# Patient Record
Sex: Male | Born: 1975 | Race: White | Hispanic: No | Marital: Married | Smoking: Never smoker
Health system: Southern US, Community
[De-identification: ages and names within clinical notes are randomized; demographics above are authoritative.]

## PROBLEM LIST (undated history)

## (undated) DIAGNOSIS — L309 Dermatitis, unspecified: Secondary | ICD-10-CM

## (undated) DIAGNOSIS — B1920 Unspecified viral hepatitis C without hepatic coma: Secondary | ICD-10-CM

---

## 2009-02-01 ENCOUNTER — Encounter (HOSPITAL_COMMUNITY)
Admission: RE | Admit: 2009-02-01 | Discharge: 2009-05-02 | Payer: Self-pay | Source: Home / Self Care | Admitting: Gastroenterology

## 2009-05-19 ENCOUNTER — Encounter (HOSPITAL_COMMUNITY): Admission: RE | Admit: 2009-05-19 | Discharge: 2009-08-17 | Payer: Self-pay | Admitting: Gastroenterology

## 2010-07-04 LAB — IRON AND TIBC
Saturation Ratios: 75 % — ABNORMAL HIGH (ref 20–55)
TIBC: 331 ug/dL (ref 215–435)
UIBC: 83 ug/dL

## 2010-07-04 LAB — CBC
HCT: 40.2 % (ref 39.0–52.0)
Hemoglobin: 14.1 g/dL (ref 13.0–17.0)
RBC: 4.16 MIL/uL — ABNORMAL LOW (ref 4.22–5.81)
RDW: 13.5 % (ref 11.5–15.5)
WBC: 3.2 10*3/uL — ABNORMAL LOW (ref 4.0–10.5)

## 2010-07-04 LAB — FERRITIN: Ferritin: 594 ng/mL — ABNORMAL HIGH (ref 22–322)

## 2010-07-17 LAB — CBC
MCV: 97.6 fL (ref 78.0–100.0)
Platelets: 65 10*3/uL — ABNORMAL LOW (ref 150–400)
RBC: 4.23 MIL/uL (ref 4.22–5.81)
WBC: 3.5 10*3/uL — ABNORMAL LOW (ref 4.0–10.5)

## 2010-07-17 LAB — IRON AND TIBC
Iron: 130 ug/dL (ref 42–135)
TIBC: 330 ug/dL (ref 215–435)
UIBC: 200 ug/dL

## 2010-07-18 LAB — IRON AND TIBC: TIBC: 310 ug/dL (ref 215–435)

## 2010-07-18 LAB — CBC
Hemoglobin: 14.1 g/dL (ref 13.0–17.0)
MCHC: 34.8 g/dL (ref 30.0–36.0)
Platelets: 69 10*3/uL — ABNORMAL LOW (ref 150–400)
RDW: 15.6 % — ABNORMAL HIGH (ref 11.5–15.5)

## 2015-11-13 ENCOUNTER — Emergency Department (HOSPITAL_COMMUNITY): Payer: BLUE CROSS/BLUE SHIELD

## 2015-11-13 ENCOUNTER — Emergency Department (HOSPITAL_COMMUNITY)
Admission: EM | Admit: 2015-11-13 | Discharge: 2015-11-13 | Disposition: A | Discharge status: Home / Self Care | Payer: BLUE CROSS/BLUE SHIELD | Attending: Emergency Medicine | Admitting: Emergency Medicine

## 2015-11-13 ENCOUNTER — Encounter (HOSPITAL_COMMUNITY): Payer: Self-pay | Admitting: Emergency Medicine

## 2015-11-13 DIAGNOSIS — Z79899 Other long term (current) drug therapy: Secondary | ICD-10-CM | POA: Diagnosis not present

## 2015-11-13 DIAGNOSIS — R233 Spontaneous ecchymoses: Secondary | ICD-10-CM

## 2015-11-13 DIAGNOSIS — R509 Fever, unspecified: Secondary | ICD-10-CM

## 2015-11-13 HISTORY — DX: Dermatitis, unspecified: L30.9

## 2015-11-13 HISTORY — DX: Unspecified viral hepatitis C without hepatic coma: B19.20

## 2015-11-13 LAB — CBC WITH DIFFERENTIAL/PLATELET
Basophils Absolute: 0 K/uL (ref 0.0–0.1)
Basophils Relative: 1 %
Eosinophils Absolute: 0 K/uL (ref 0.0–0.7)
Eosinophils Relative: 0 %
HCT: 44.4 % (ref 39.0–52.0)
Hemoglobin: 16.1 g/dL (ref 13.0–17.0)
Lymphocytes Relative: 23 %
Lymphs Abs: 0.9 K/uL (ref 0.7–4.0)
MCH: 31.1 pg (ref 26.0–34.0)
MCHC: 36.3 g/dL — ABNORMAL HIGH (ref 30.0–36.0)
MCV: 85.9 fL (ref 78.0–100.0)
Monocytes Absolute: 0.5 K/uL (ref 0.1–1.0)
Monocytes Relative: 13 %
Neutro Abs: 2.4 K/uL (ref 1.7–7.7)
Neutrophils Relative %: 64 %
Platelets: 94 K/uL — ABNORMAL LOW (ref 150–400)
RBC: 5.17 MIL/uL (ref 4.22–5.81)
RDW: 12.7 % (ref 11.5–15.5)
WBC: 3.8 K/uL — ABNORMAL LOW (ref 4.0–10.5)

## 2015-11-13 LAB — COMPREHENSIVE METABOLIC PANEL WITH GFR
ALT: 32 U/L (ref 17–63)
AST: 32 U/L (ref 15–41)
Albumin: 4.5 g/dL (ref 3.5–5.0)
Alkaline Phosphatase: 38 U/L (ref 38–126)
Anion gap: 7 (ref 5–15)
BUN: 10 mg/dL (ref 6–20)
CO2: 27 mmol/L (ref 22–32)
Calcium: 9 mg/dL (ref 8.9–10.3)
Chloride: 99 mmol/L — ABNORMAL LOW (ref 101–111)
Creatinine, Ser: 0.92 mg/dL (ref 0.61–1.24)
GFR calc Af Amer: >60 mL/min
GFR calc non Af Amer: >60 mL/min
Glucose, Bld: 126 mg/dL — ABNORMAL HIGH (ref 65–99)
Potassium: 3.4 mmol/L — ABNORMAL LOW (ref 3.5–5.1)
Sodium: 133 mmol/L — ABNORMAL LOW (ref 135–145)
Total Bilirubin: 1.2 mg/dL (ref 0.3–1.2)
Total Protein: 8.4 g/dL — ABNORMAL HIGH (ref 6.5–8.1)

## 2015-11-13 LAB — CK: Total CK: 167 U/L (ref 49–397)

## 2015-11-13 LAB — URINALYSIS, ROUTINE W REFLEX MICROSCOPIC
Glucose, UA: NEGATIVE mg/dL
Hgb urine dipstick: NEGATIVE
Ketones, ur: NEGATIVE mg/dL
Leukocytes, UA: NEGATIVE
Nitrite: NEGATIVE
Protein, ur: NEGATIVE mg/dL
Specific Gravity, Urine: 1.022 (ref 1.005–1.030)
pH: 6 (ref 5.0–8.0)

## 2015-11-13 LAB — I-STAT CG4 LACTIC ACID, ED: Lactic Acid, Venous: 1.25 mmol/L (ref 0.5–1.9)

## 2015-11-13 MED ORDER — DOXYCYCLINE HYCLATE 100 MG PO TABS
100.0000 mg | ORAL_TABLET | Freq: Once | ORAL | Status: AC
  Administered 2015-11-13: 100 mg via ORAL
  Filled 2015-11-13: qty 1

## 2015-11-13 MED ORDER — DOXYCYCLINE HYCLATE 100 MG PO CAPS: 100.0000 mg | ORAL_CAPSULE | Freq: Two times a day (BID) | ORAL | 0 refills | Status: AC

## 2015-11-13 MED ORDER — ACETAMINOPHEN 325 MG PO TABS
650.0000 mg | ORAL_TABLET | Freq: Once | ORAL | Status: AC
  Administered 2015-11-13: 650 mg via ORAL
  Filled 2015-11-13: qty 2

## 2015-11-13 MED ORDER — SODIUM CHLORIDE 0.9 % IV BOLUS (SEPSIS)
1000.0000 mL | Freq: Once | INTRAVENOUS | Status: AC
  Administered 2015-11-13: 1000 mL via INTRAVENOUS

## 2015-11-13 NOTE — Discharge Instructions (Signed)
Take antibiotics as prescribed. Follow up with your primary care provider. You will need to follow up on these results. Return to the ED if you experience severe neck pain/stiffness, vomiting, increased rash, chest pain, difficulty breathing, confusion.

## 2015-11-13 NOTE — ED Provider Notes (Signed)
[facility]-EMERGENCY DEPT Provider Note   CSN: [id number] Arrival date & time: 11/13/15  1347  First Provider Contact:  First MD Initiated Contact with Patient 11/13/15 1857        History   Chief Complaint Chief Complaint  Patient presents with  . Rash    sent by PCP for possible RMSF  . Neck Pain    HPI Paul Mccarty is a 40 y.o. male with hepatitis C, cirrhosis who presents to the ED today complaining of fever. Patient states that last Thursday, 4 days ago, he worked out very hard. The following day he felt diffuse body aches and neck pain. Patient states that he thought this was from working out so he did not take anything of it. However, following that he developed a fever of 102. He did not take anything for his fever. Patient states that he has continued to have an intermittent fever since then ranging up to 103.5. Patient states that he saw his PCP today and they noticed a petechial rash on the dorsum of his bilateral hands so they sent him to the ED with concern of [facility] spotted fever. 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 He doesn't recall any tick bites. He denies any sore throat, cough, dysuria, chest pain or shortness of breath. Patient states that his neck is sore feels like a muscle cramp. He is able to move it in all directions without difficulty. He denies any nausea or vomiting.  HPI  Past Medical History:  Diagnosis Date  . Dermatitis   . Hepatitis C     There are no active problems to display for this patient.   History reviewed. No pertinent surgical history.     Home Medications    Prior to Admission medications   Medication Sig Start Date End Date Taking? Authorizing Provider  acetaminophen (TYLENOL) 650 MG CR tablet Take 1,300 mg by mouth every 8 (eight) hours as needed for pain.   Yes Historical Provider, MD  Doxylamine Succinate, Sleep, (SLEEP AID PO) Take 2 tablets by mouth at bedtime as needed (for sleep).   Yes  Historical Provider, MD    Family History History reviewed. No pertinent family history.  Social History Social History  Substance Use Topics  . Smoking status: Never Smoker  . Smokeless tobacco: Never Used  . Alcohol use No     Allergies   Nsaids; Codeine; Erythromycin; and Talwin [pentazocine]   Review of Systems Review of Systems  All other systems reviewed and are negative.    Physical Exam Updated Vital Signs BP 111/84 (BP Location: Right Arm)   Pulse 91   Temp 101.1 F (38.4 C) (Oral)   Resp 17   Ht  (1.778 m)   Wt 99.8 kg   SpO2 98%   BMI 31.57 kg/m   Physical Exam  Constitutional: He is oriented to person, place, and time. He appears well-developed and well-nourished. No distress.  HENT:  Head: Normocephalic and atraumatic.  Mouth/Throat: No oropharyngeal exudate.  Eyes: Conjunctivae and EOM are normal. Pupils are equal, round, and reactive to light. Right eye exhibits no discharge. Left eye exhibits no discharge. No scleral icterus.  Neck: Normal range of motion.  No meningismus  Cardiovascular: Normal rate, regular rhythm, normal heart sounds and intact distal pulses.  Exam reveals no gallop and no friction rub.   No murmur heard. Pulmonary/Chest: Effort normal and breath sounds normal. No respiratory distress. He has no [id number]. He has  no rales. He exhibits no tenderness.  Abdominal: Soft. He exhibits no distension. There is no tenderness. There is no guarding.  Musculoskeletal: Normal range of motion. He exhibits no edema.  Neurological: He is alert and oriented to person, place, and time.  Skin: Skin is warm and dry. Rash noted. He is not diaphoretic. No erythema. No pallor.  Petechial rash over dorsum of bilateral hands  Psychiatric: He has a normal mood and affect. His behavior is normal.  Nursing note and vitals reviewed.    ED Treatments / Results  Labs (all labs ordered are listed, but only abnormal results are displayed) Labs  Reviewed  CULTURE, BLOOD (ROUTINE X 2)  CULTURE, BLOOD (ROUTINE X 2)  COMPREHENSIVE METABOLIC PANEL  CBC WITH DIFFERENTIAL/PLATELET  URINALYSIS, ROUTINE W REFLEX MICROSCOPIC (NOT AT [facility])  ROCKY MTN SPOTTED FVR ABS PNL(IGG+IGM)  B. BURGDORFI ANTIBODIES  CK  I-STAT CG4 LACTIC ACID, ED    EKG  EKG Interpretation None       Radiology Dg Chest 2 View  Result Date: 11/13/2015 CLINICAL DATA:  40 year old male with possible [facility] spotted fever. Initial encounter. History of hepatitis-C. EXAM: CHEST  2 VIEW COMPARISON:  None. FINDINGS: Normal lung volumes. Normal cardiac size and mediastinal contours. Visualized tracheal air column is within normal limits. The lungs are clear. No pneumothorax or pleural effusion. Mild levoconvex spinal curvature. No acute osseous abnormality identified. IMPRESSION: Negative, no acute cardiopulmonary abnormality. Electronically Signed   By: Odessa Fleming M.D.   On: 11/13/2015 19:49    Procedures Procedures (including critical care time)  Medications Ordered in ED Medications  sodium chloride 0.9 % bolus 1,000 mL (1,000 mLs Intravenous New Bag/Given 11/13/15 1952)  acetaminophen (TYLENOL) tablet 650 mg (650 mg Oral Given 11/13/15 1936)     Initial Impression / Assessment and Plan / ED Course  I have reviewed the triage vital signs and the nursing notes.  Pertinent labs & imaging results that were available during my care of the patient were reviewed by me and considered in my medical decision making (see chart for details).  Clinical Course    40 year old male with pmhx of hepatitis C, Cirrhosis presents to the ED with fever of unknown origin for 3-4 days. Patient seen by his PCP today she noticed a petechial rash in the dorsum of his hands became concerned [place] fever. On exam, patient appears well. He is nontoxic and nonseptic appearing. No meningeal signs. Abdomen is nontender. Chest x-ray unremarkable, UA negative for  infection. No source of infection found. No leukocytosis. Pt does not meet SIRS or sepsis criteria. [facility] spotted fever and Lyme disease titers were sent. Temp now coming down, 98.9 now. Patient reports significant symptomatic improvement after IV fluids. Patient will be discharged home with doxycycline for [facility] spotted fever prophylaxis. He will follow up with his PCP tomorrow for reevaluation. Strict return precautions given and discussed.  Patient was discussed with and seen by Dr. Jacqulyn Bath who agrees with the treatment plan.    Final Clinical Impressions(s) / ED Diagnoses   Final diagnoses:  None    New Prescriptions New Prescriptions   No medications on file     Dub Mikes, PA-C 11/15/15 1155    Maia Plan, MD 11/15/15 [phone]

## 2015-11-13 NOTE — ED Triage Notes (Signed)
Pt sent by PCP for possible [facility] spotted fever. Pt works outside as a Chiropractor. Pt began to have a fever 3 days ago accompanied by body aches, sweating, mild HA and a petichial rash. WBC unremarkable at PCP, but was sent for further eval.

## 2015-11-13 NOTE — ED Notes (Signed)
Pt sent by [facility] c/o fever, rash on L hand, and malaise x 3 days.  PCP reports fever of 102.6 in office.  Sts Pt works outside.  Hx of Hep C.

## 2015-11-15 LAB — B. BURGDORFI ANTIBODIES: B burgdorferi Ab IgG+IgM: <0.91 {ISR} (ref 0.00–0.90)

## 2015-11-18 LAB — CULTURE, BLOOD (ROUTINE X 2)
Culture: NO GROWTH
Culture: NO GROWTH

## 2015-11-18 LAB — ROCKY MTN SPOTTED FVR ABS PNL(IGG+IGM)
RMSF IgG: POSITIVE — AB
RMSF IgM: 1.22 {index} — ABNORMAL HIGH (ref 0.00–0.89)

## 2015-11-18 LAB — RMSF, IGG, IFA: RMSF, IGG, IFA: <1:64 {titer}

## 2017-09-22 IMAGING — CR DG CHEST 2V
2 series · 2 of 2 positions shown · non-contrast
Comparison: None.

CLINICAL DATA: 40-year-old male with possible [place] fever. Initial encounter. History of hepatitis-C.

EXAM:
CHEST  2 VIEW

[w chest pa]
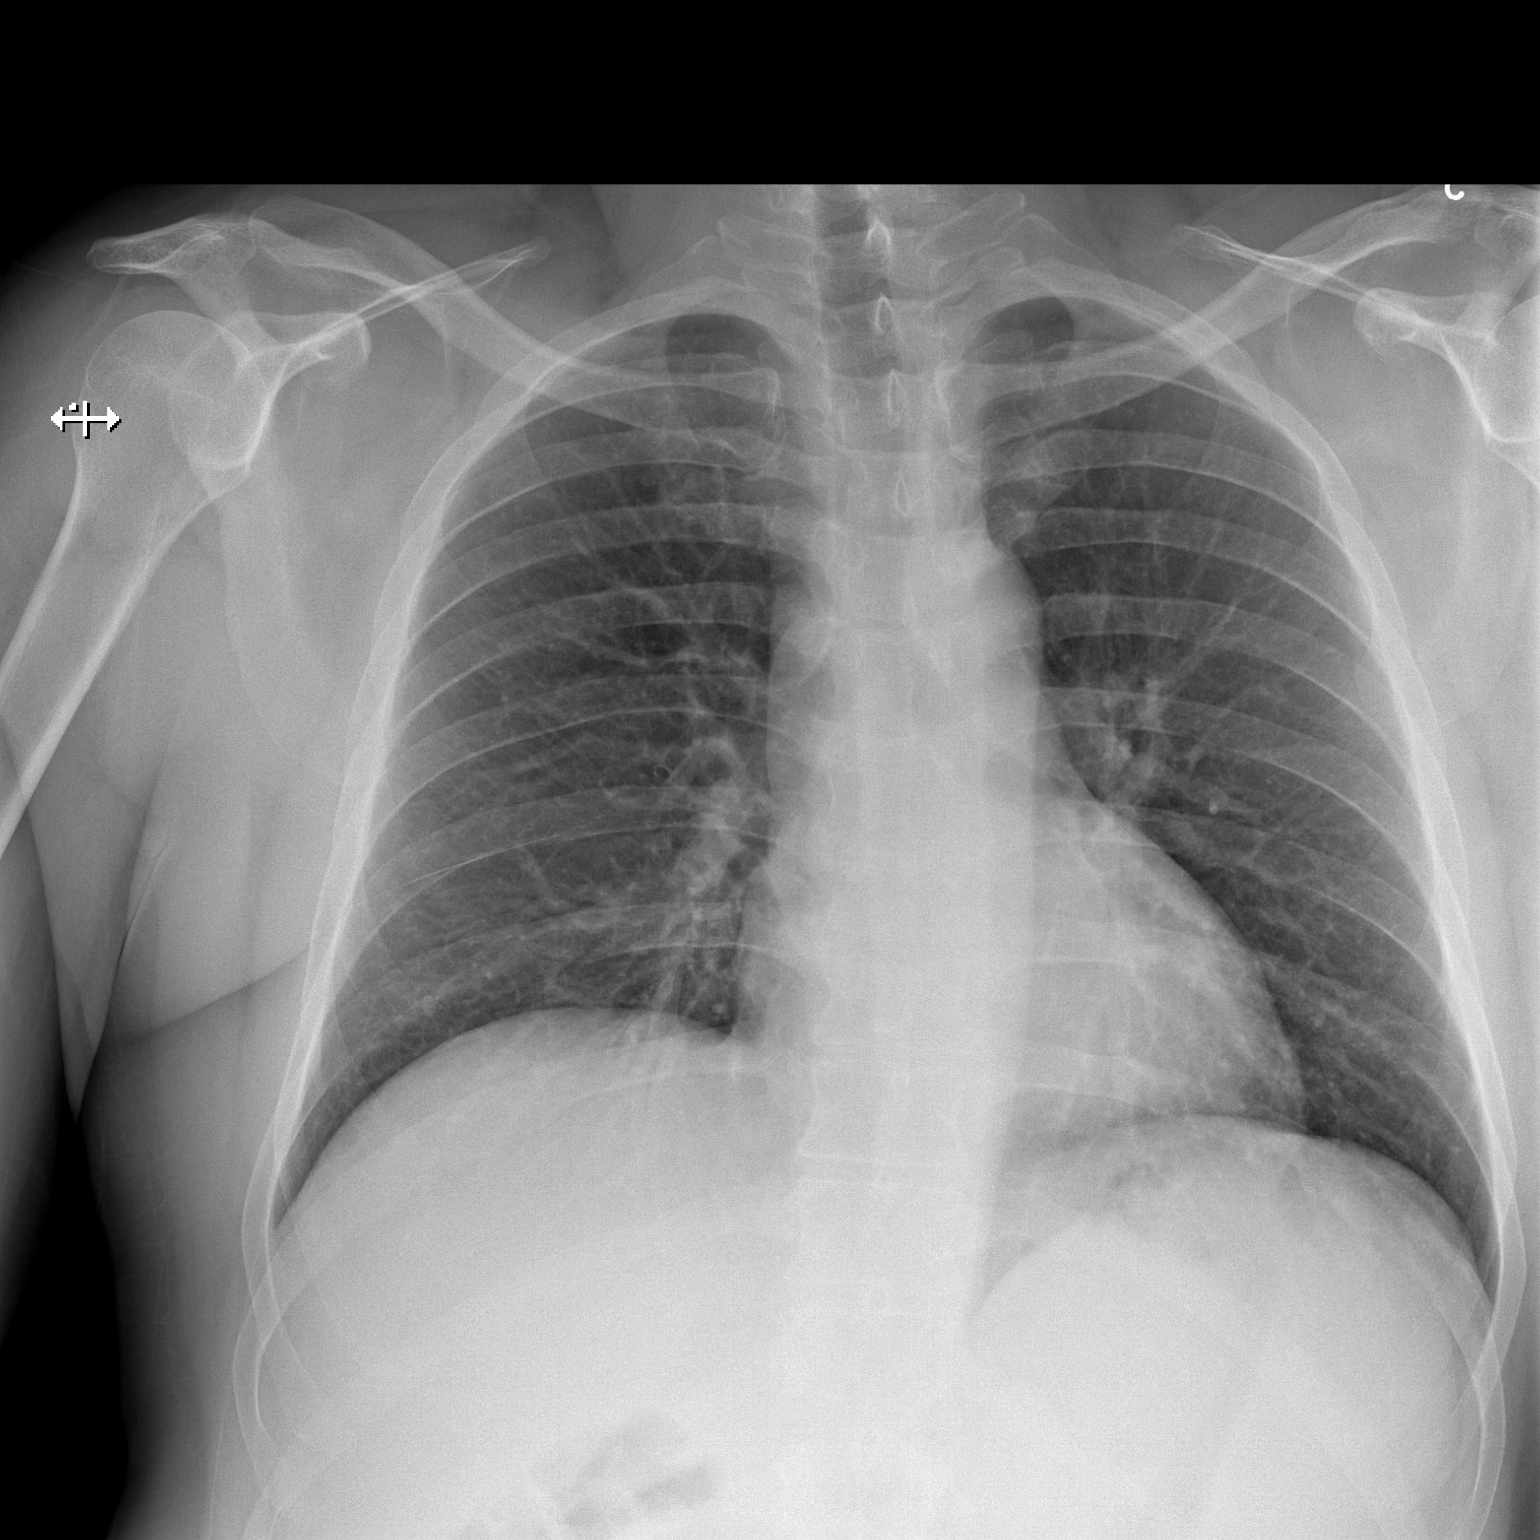

[w chest lat]
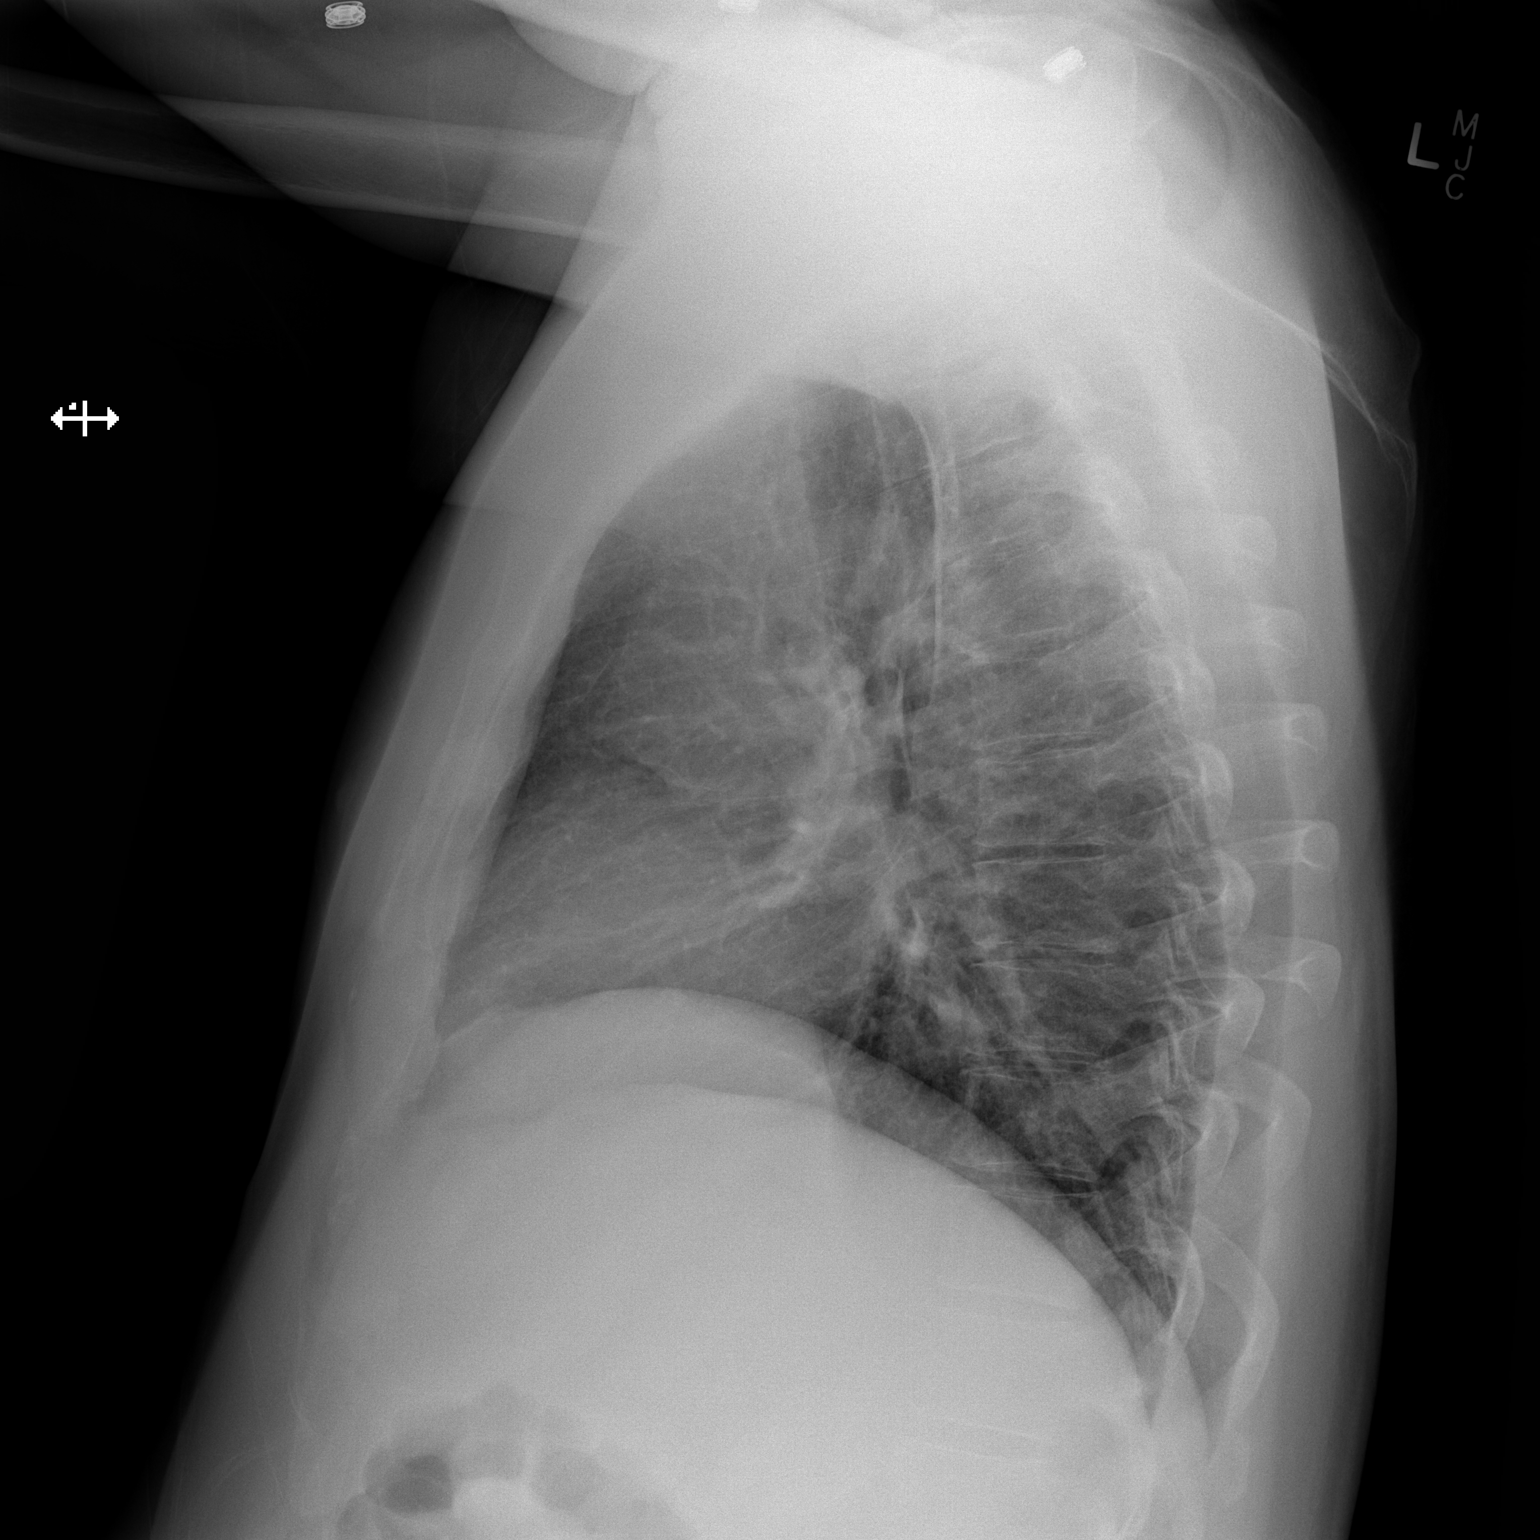

[2 of 2 positions shown; findings below may reference images not displayed]

FINDINGS: Normal lung volumes. Normal cardiac size and mediastinal contours.
Visualized tracheal air column is within normal limits. The lungs
are clear. No pneumothorax or pleural effusion. Mild levoconvex
spinal curvature. No acute osseous abnormality identified.
IMPRESSION: Negative, no acute cardiopulmonary abnormality.

## 2019-02-17 DIAGNOSIS — Z713 Dietary counseling and surveillance: Secondary | ICD-10-CM | POA: Diagnosis not present

## 2019-03-25 DIAGNOSIS — R188 Other ascites: Secondary | ICD-10-CM | POA: Diagnosis not present

## 2019-03-25 DIAGNOSIS — K7460 Unspecified cirrhosis of liver: Secondary | ICD-10-CM | POA: Diagnosis not present

## 2019-03-25 DIAGNOSIS — B1920 Unspecified viral hepatitis C without hepatic coma: Secondary | ICD-10-CM | POA: Diagnosis not present

## 2019-03-31 DIAGNOSIS — Z713 Dietary counseling and surveillance: Secondary | ICD-10-CM | POA: Diagnosis not present

## 2019-04-06 DIAGNOSIS — R188 Other ascites: Secondary | ICD-10-CM | POA: Diagnosis not present

## 2019-04-06 DIAGNOSIS — K7460 Unspecified cirrhosis of liver: Secondary | ICD-10-CM | POA: Diagnosis not present

## 2019-04-12 DIAGNOSIS — R188 Other ascites: Secondary | ICD-10-CM | POA: Diagnosis not present

## 2019-04-12 DIAGNOSIS — K7460 Unspecified cirrhosis of liver: Secondary | ICD-10-CM | POA: Diagnosis not present

## 2019-04-12 DIAGNOSIS — K766 Portal hypertension: Secondary | ICD-10-CM | POA: Diagnosis not present

## 2019-04-12 DIAGNOSIS — K7469 Other cirrhosis of liver: Secondary | ICD-10-CM | POA: Diagnosis not present

## 2019-05-12 DIAGNOSIS — Z713 Dietary counseling and surveillance: Secondary | ICD-10-CM | POA: Diagnosis not present

## 2019-06-23 DIAGNOSIS — Z713 Dietary counseling and surveillance: Secondary | ICD-10-CM | POA: Diagnosis not present

## 2019-08-02 DIAGNOSIS — W57XXXA Bitten or stung by nonvenomous insect and other nonvenomous arthropods, initial encounter: Secondary | ICD-10-CM | POA: Diagnosis not present

## 2019-08-02 DIAGNOSIS — S40869A Insect bite (nonvenomous) of unspecified upper arm, initial encounter: Secondary | ICD-10-CM | POA: Diagnosis not present

## 2019-08-25 DIAGNOSIS — Z713 Dietary counseling and surveillance: Secondary | ICD-10-CM | POA: Diagnosis not present

## 2019-09-20 DIAGNOSIS — Z862 Personal history of diseases of the blood and blood-forming organs and certain disorders involving the immune mechanism: Secondary | ICD-10-CM | POA: Diagnosis not present

## 2019-09-24 DIAGNOSIS — Z862 Personal history of diseases of the blood and blood-forming organs and certain disorders involving the immune mechanism: Secondary | ICD-10-CM | POA: Diagnosis not present

## 2019-10-06 DIAGNOSIS — Z713 Dietary counseling and surveillance: Secondary | ICD-10-CM | POA: Diagnosis not present

## 2019-11-10 DIAGNOSIS — Z713 Dietary counseling and surveillance: Secondary | ICD-10-CM | POA: Diagnosis not present

## 2019-12-15 DIAGNOSIS — Z713 Dietary counseling and surveillance: Secondary | ICD-10-CM | POA: Diagnosis not present

## 2019-12-21 DIAGNOSIS — Z862 Personal history of diseases of the blood and blood-forming organs and certain disorders involving the immune mechanism: Secondary | ICD-10-CM | POA: Diagnosis not present

## 2020-01-12 DIAGNOSIS — Z713 Dietary counseling and surveillance: Secondary | ICD-10-CM | POA: Diagnosis not present

## 2020-01-15 DIAGNOSIS — Z20822 Contact with and (suspected) exposure to covid-19: Secondary | ICD-10-CM | POA: Diagnosis not present

## 2020-01-20 DIAGNOSIS — G5762 Lesion of plantar nerve, left lower limb: Secondary | ICD-10-CM | POA: Diagnosis not present

## 2020-01-20 DIAGNOSIS — M79672 Pain in left foot: Secondary | ICD-10-CM | POA: Diagnosis not present

## 2020-01-21 DIAGNOSIS — G5762 Lesion of plantar nerve, left lower limb: Secondary | ICD-10-CM | POA: Diagnosis not present

## 2020-02-01 DIAGNOSIS — H524 Presbyopia: Secondary | ICD-10-CM | POA: Diagnosis not present

## 2020-02-02 DIAGNOSIS — B182 Chronic viral hepatitis C: Secondary | ICD-10-CM | POA: Diagnosis not present

## 2020-02-02 DIAGNOSIS — M8580 Other specified disorders of bone density and structure, unspecified site: Secondary | ICD-10-CM | POA: Diagnosis not present

## 2020-02-02 DIAGNOSIS — K7689 Other specified diseases of liver: Secondary | ICD-10-CM | POA: Diagnosis not present

## 2020-02-02 DIAGNOSIS — Z885 Allergy status to narcotic agent status: Secondary | ICD-10-CM | POA: Diagnosis not present

## 2020-02-02 DIAGNOSIS — R161 Splenomegaly, not elsewhere classified: Secondary | ICD-10-CM | POA: Diagnosis not present

## 2020-02-02 DIAGNOSIS — K7469 Other cirrhosis of liver: Secondary | ICD-10-CM | POA: Diagnosis not present

## 2020-02-02 DIAGNOSIS — K7460 Unspecified cirrhosis of liver: Secondary | ICD-10-CM | POA: Diagnosis not present

## 2020-02-02 DIAGNOSIS — Z6828 Body mass index (BMI) 28.0-28.9, adult: Secondary | ICD-10-CM | POA: Diagnosis not present

## 2020-02-02 DIAGNOSIS — B1910 Unspecified viral hepatitis B without hepatic coma: Secondary | ICD-10-CM | POA: Diagnosis not present

## 2020-02-02 DIAGNOSIS — Z1321 Encounter for screening for nutritional disorder: Secondary | ICD-10-CM | POA: Diagnosis not present

## 2020-02-02 DIAGNOSIS — K766 Portal hypertension: Secondary | ICD-10-CM | POA: Diagnosis not present

## 2020-02-09 DIAGNOSIS — Z713 Dietary counseling and surveillance: Secondary | ICD-10-CM | POA: Diagnosis not present

## 2020-03-23 DIAGNOSIS — Z862 Personal history of diseases of the blood and blood-forming organs and certain disorders involving the immune mechanism: Secondary | ICD-10-CM | POA: Diagnosis not present

## 2020-03-23 DIAGNOSIS — B182 Chronic viral hepatitis C: Secondary | ICD-10-CM | POA: Diagnosis not present

## 2020-03-29 DIAGNOSIS — M79672 Pain in left foot: Secondary | ICD-10-CM | POA: Diagnosis not present

## 2020-03-29 DIAGNOSIS — G5762 Lesion of plantar nerve, left lower limb: Secondary | ICD-10-CM | POA: Diagnosis not present

## 2020-03-30 DIAGNOSIS — G5762 Lesion of plantar nerve, left lower limb: Secondary | ICD-10-CM | POA: Diagnosis not present

## 2020-04-25 DIAGNOSIS — Z713 Dietary counseling and surveillance: Secondary | ICD-10-CM | POA: Diagnosis not present

## 2020-05-30 DIAGNOSIS — G5762 Lesion of plantar nerve, left lower limb: Secondary | ICD-10-CM | POA: Diagnosis not present

## 2020-05-30 DIAGNOSIS — M79672 Pain in left foot: Secondary | ICD-10-CM | POA: Diagnosis not present

## 2020-06-07 DIAGNOSIS — Z713 Dietary counseling and surveillance: Secondary | ICD-10-CM | POA: Diagnosis not present

## 2020-07-12 DIAGNOSIS — Z713 Dietary counseling and surveillance: Secondary | ICD-10-CM | POA: Diagnosis not present

## 2020-07-24 DIAGNOSIS — K7460 Unspecified cirrhosis of liver: Secondary | ICD-10-CM | POA: Diagnosis not present

## 2020-07-24 DIAGNOSIS — D696 Thrombocytopenia, unspecified: Secondary | ICD-10-CM | POA: Diagnosis not present

## 2020-08-21 DIAGNOSIS — D509 Iron deficiency anemia, unspecified: Secondary | ICD-10-CM | POA: Diagnosis not present

## 2020-08-23 DIAGNOSIS — Z713 Dietary counseling and surveillance: Secondary | ICD-10-CM | POA: Diagnosis not present

## 2020-08-29 DIAGNOSIS — Z6829 Body mass index (BMI) 29.0-29.9, adult: Secondary | ICD-10-CM | POA: Diagnosis not present

## 2020-08-29 DIAGNOSIS — B1920 Unspecified viral hepatitis C without hepatic coma: Secondary | ICD-10-CM | POA: Diagnosis not present

## 2020-08-29 DIAGNOSIS — K7469 Other cirrhosis of liver: Secondary | ICD-10-CM | POA: Diagnosis not present

## 2020-08-29 DIAGNOSIS — K766 Portal hypertension: Secondary | ICD-10-CM | POA: Diagnosis not present

## 2020-08-29 DIAGNOSIS — Z1321 Encounter for screening for nutritional disorder: Secondary | ICD-10-CM | POA: Diagnosis not present

## 2020-08-29 DIAGNOSIS — Z885 Allergy status to narcotic agent status: Secondary | ICD-10-CM | POA: Diagnosis not present

## 2020-08-29 DIAGNOSIS — M8580 Other specified disorders of bone density and structure, unspecified site: Secondary | ICD-10-CM | POA: Diagnosis not present

## 2020-09-11 DIAGNOSIS — S40861A Insect bite (nonvenomous) of right upper arm, initial encounter: Secondary | ICD-10-CM | POA: Diagnosis not present

## 2020-09-11 DIAGNOSIS — W57XXXA Bitten or stung by nonvenomous insect and other nonvenomous arthropods, initial encounter: Secondary | ICD-10-CM | POA: Diagnosis not present

## 2020-12-12 DIAGNOSIS — K769 Liver disease, unspecified: Secondary | ICD-10-CM | POA: Diagnosis not present

## 2020-12-12 DIAGNOSIS — K7460 Unspecified cirrhosis of liver: Secondary | ICD-10-CM | POA: Diagnosis not present

## 2020-12-12 DIAGNOSIS — Z8505 Personal history of malignant neoplasm of liver: Secondary | ICD-10-CM | POA: Diagnosis not present

## 2020-12-12 DIAGNOSIS — K869 Disease of pancreas, unspecified: Secondary | ICD-10-CM | POA: Diagnosis not present

## 2020-12-12 DIAGNOSIS — K766 Portal hypertension: Secondary | ICD-10-CM | POA: Diagnosis not present

## 2021-02-06 DIAGNOSIS — H524 Presbyopia: Secondary | ICD-10-CM | POA: Diagnosis not present

## 2021-03-29 DIAGNOSIS — K7460 Unspecified cirrhosis of liver: Secondary | ICD-10-CM | POA: Diagnosis not present

## 2023-12-01 ENCOUNTER — Encounter: Payer: Self-pay | Admitting: Physician Assistant

## 2023-12-01 ENCOUNTER — Ambulatory Visit (INDEPENDENT_AMBULATORY_CARE_PROVIDER_SITE_OTHER): Admitting: Physician Assistant

## 2023-12-01 VITALS — BP 122/79

## 2023-12-01 DIAGNOSIS — L308 Other specified dermatitis: Secondary | ICD-10-CM | POA: Diagnosis not present

## 2023-12-01 DIAGNOSIS — D225 Melanocytic nevi of trunk: Secondary | ICD-10-CM | POA: Diagnosis not present

## 2023-12-01 DIAGNOSIS — D229 Melanocytic nevi, unspecified: Secondary | ICD-10-CM

## 2023-12-01 DIAGNOSIS — D492 Neoplasm of unspecified behavior of bone, soft tissue, and skin: Secondary | ICD-10-CM | POA: Diagnosis not present

## 2023-12-01 DIAGNOSIS — L814 Other melanin hyperpigmentation: Secondary | ICD-10-CM | POA: Diagnosis not present

## 2023-12-01 DIAGNOSIS — Z1283 Encounter for screening for malignant neoplasm of skin: Secondary | ICD-10-CM | POA: Diagnosis not present

## 2023-12-01 DIAGNOSIS — W908XXA Exposure to other nonionizing radiation, initial encounter: Secondary | ICD-10-CM

## 2023-12-01 DIAGNOSIS — D1801 Hemangioma of skin and subcutaneous tissue: Secondary | ICD-10-CM

## 2023-12-01 DIAGNOSIS — L821 Other seborrheic keratosis: Secondary | ICD-10-CM

## 2023-12-01 DIAGNOSIS — D489 Neoplasm of uncertain behavior, unspecified: Secondary | ICD-10-CM

## 2023-12-01 DIAGNOSIS — L578 Other skin changes due to chronic exposure to nonionizing radiation: Secondary | ICD-10-CM

## 2023-12-01 MED ORDER — TRIAMCINOLONE ACETONIDE 0.1 % EX CREA: 1.0000 | TOPICAL_CREAM | Freq: Two times a day (BID) | CUTANEOUS | 2 refills | Status: AC | PRN

## 2023-12-01 NOTE — Progress Notes (Signed)
 New Patient Visit   Subjective  Paul Mccarty is a 48 y.o. male NEW PATIENT who presents for the following: Pt states he just wants a TBSE. Pt states he has one spot on his back that sticks out to him that is sometimes raised. Pt states his ankles get dry skin and very itchy itchy - especially in the Winter. Pt does not wash with anything particular. Pt does has a family history of skin cancer. He, himself, has never had skin cancer. Had skin exam many many years ago.   Has worked outside most of his life. Currently works full time at the [employer] in [place].     The following portions of the chart were reviewed this encounter and updated as appropriate: medications, allergies, medical history  Review of Systems:  No other skin or systemic complaints except as noted in HPI or Assessment and Plan.  Objective  Well appearing patient in no apparent distress; mood and affect are within normal limits.  A full examination was performed including scalp, head, eyes, ears, nose, lips, neck, chest, axillae, abdomen, back, buttocks, bilateral upper extremities, bilateral lower extremities, hands, feet, fingers, toes, fingernails, and toenails. All findings within normal limits unless otherwise noted below.   Relevant exam findings are noted in the Assessment and Plan.  Right Upper Back 0.5 cm Irregular brown papule    Assessment & Plan    Asteatotic eczema   Exam: legs and ankles  Treatment Plan: Apply TMC 0.1% cream as needed twice a day on affected areas avoid face, groin and underarms.    LENTIGINES, SEBORRHEIC KERATOSES, HEMANGIOMAS - Benign normal skin lesions - Benign-appearing - Call for any changes  MELANOCYTIC NEVI - Tan-brown and/or pink-flesh-colored symmetric macules and papules - Benign appearing on exam today - Observation - Call clinic for new or changing moles - Recommend daily use of broad spectrum spf 30+ sunscreen to sun-exposed areas.   ACTINIC  DAMAGE - Chronic condition, secondary to cumulative UV/sun exposure - diffuse scaly erythematous macules with underlying dyspigmentation - Recommend daily broad spectrum sunscreen SPF 30+ to sun-exposed areas, reapply every 2 hours as needed.  - Staying in the shade or wearing long sleeves, sun glasses (UVA+UVB protection) and wide brim hats (4-inch brim around the entire circumference of the hat) are also recommended for sun protection.  - Call for new or changing lesions.  SKIN CANCER SCREENING PERFORMED TODAY   NEOPLASM OF UNCERTAIN BEHAVIOR Right Upper Back Skin / nail biopsy Type of biopsy: tangential   Informed consent: discussed and consent obtained   Timeout: patient name, date of birth, surgical site, and procedure verified   Procedure prep:  Patient was prepped and draped in usual sterile fashion Prep type:  Isopropyl alcohol Anesthesia: the lesion was anesthetized in a standard fashion   Anesthetic:  1% lidocaine w/ epinephrine 1-100,000 buffered w/ 8.4% NaHCO3 Instrument used: flexible razor blade   Hemostasis achieved with: pressure and aluminum chloride   Outcome: patient tolerated procedure well   Post-procedure details: sterile dressing applied and wound care instructions given   Dressing type: bandage and petrolatum    Specimen 1 - Surgical pathology Differential Diagnosis: DM vs MM  Check Margins: No ASTEATOTIC ECZEMA   SCREENING EXAM FOR SKIN CANCER   LENTIGINES   SEBORRHEIC KERATOSIS   CHERRY ANGIOMA   MULTIPLE BENIGN NEVI   ACTINIC SKIN DAMAGE    Return in about 1 year (around 11/30/2024).  I, Doyce Pan, CMA, am acting as scribe  for Safiya Girdler K, PA-C.   Documentation: I have reviewed the above documentation for accuracy and completeness, and I agree with the above.  Alieu Finnigan K, PA-C

## 2023-12-02 LAB — SURGICAL PATHOLOGY

## 2023-12-03 ENCOUNTER — Ambulatory Visit: Payer: Self-pay | Admitting: Dermatology

## 2024-01-22 ENCOUNTER — Encounter: Payer: Self-pay | Admitting: Dermatology

## 2024-01-28 ENCOUNTER — Ambulatory Visit (INDEPENDENT_AMBULATORY_CARE_PROVIDER_SITE_OTHER): Admitting: Dermatology

## 2024-01-28 ENCOUNTER — Encounter: Payer: Self-pay | Admitting: Dermatology

## 2024-01-28 VITALS — BP 114/81 | HR 81 | Temp 98.4°F

## 2024-01-28 DIAGNOSIS — D225 Melanocytic nevi of trunk: Secondary | ICD-10-CM | POA: Diagnosis not present

## 2024-01-28 DIAGNOSIS — D239 Other benign neoplasm of skin, unspecified: Secondary | ICD-10-CM

## 2024-01-28 NOTE — Patient Instructions (Signed)

## 2024-01-28 NOTE — Progress Notes (Unsigned)
   Follow-Up Visit   Subjective  Askari Kinley is a 48 y.o. male who presents for the following: Excision of right upper back  The following portions of the chart were reviewed this encounter and updated as appropriate: medications, allergies, medical history  Review of Systems:  No other skin or systemic complaints except as noted in HPI or Assessment and Plan.  Objective  Well appearing patient in no apparent distress; mood and affect are within normal limits.  A focused examination was performed of the following areas: Right upper back Relevant physical exam findings are noted in the Assessment and Plan.     Assessment & Plan   DYSPLASTIC NEVUS Right Upper Back Skin excision - Right Upper Back  Excision method:  elliptical Lesion length (cm):  1.1 Lesion width (cm):  0.6 Margin per side (cm):  0.5 Total excision diameter (cm):  2.1 Informed consent: discussed and consent obtained   Timeout: patient name, date of birth, surgical site, and procedure verified   Procedure prep:  Patient was prepped and draped in usual sterile fashion Prep type:  Chlorhexidine Anesthesia: the lesion was anesthetized in a standard fashion   Anesthetic:  1% lidocaine w/ epinephrine 1-100,000 buffered w/ 8.4% NaHCO3 Instrument used: #15 blade   Hemostasis achieved with: suture, pressure and electrodesiccation   Outcome: patient tolerated procedure well with no complications   Post-procedure details: sterile dressing applied and wound care instructions given   Dressing type: bandage and pressure dressing    Skin repair - Right Upper Back Complexity:  Complex Final length (cm):  4.8 Informed consent: discussed and consent obtained   Timeout: patient name, date of birth, surgical site, and procedure verified   Procedure prep:  Patient was prepped and draped in usual sterile fashion Prep type:  Chlorhexidine Anesthesia: the lesion was anesthetized in a standard fashion   Anesthetic:  1%  lidocaine w/ epinephrine 1-100,000 buffered w/ 8.4% NaHCO3 Reason for type of repair: reduce tension to allow closure and preserve normal anatomy   Undermining: edges undermined   Subcutaneous layers (deep stitches):  Suture size:  3-0 Suture type: PDS (polydioxanone)   Stitches:  Buried vertical mattress Fine/surface layer approximation (top stitches):  Suture type: cyanoacrylate tissue glue   Hemostasis achieved with: suture, pressure and electrodesiccation Outcome: patient tolerated procedure well with no complications   Post-procedure details: sterile dressing applied and wound care instructions given   Dressing type: bandage and pressure dressing    Specimen 1 - Surgical pathology Differential Diagnosis: DN [phone] Check Margins: No   No follow-ups on file.  I, Berwyn Lesches, Surg Tech III, am acting as scribe for RUFUS CHRISTELLA HOLY, MD.   Documentation: I have reviewed the above documentation for accuracy and completeness, and I agree with the above.  RUFUS CHRISTELLA HOLY, MD

## 2024-01-29 ENCOUNTER — Ambulatory Visit: Payer: Self-pay | Admitting: Dermatology

## 2024-01-29 ENCOUNTER — Encounter: Payer: Self-pay | Admitting: Dermatology

## 2024-01-29 LAB — SURGICAL PATHOLOGY

## 2024-12-01 ENCOUNTER — Ambulatory Visit: Admitting: Physician Assistant
